# Patient Record
Sex: Female | Born: 1995 | Race: Black or African American | Hispanic: No | Smoking: Former smoker
Health system: Southern US, Community
[De-identification: ages and names within clinical notes are randomized; demographics above are authoritative.]

## PROBLEM LIST (undated history)

## (undated) DIAGNOSIS — L509 Urticaria, unspecified: Secondary | ICD-10-CM

## (undated) HISTORY — DX: Urticaria, unspecified: L50.9

---
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
## 2015-02-25 ENCOUNTER — Emergency Department (INDEPENDENT_AMBULATORY_CARE_PROVIDER_SITE_OTHER)
Admission: EM | Admit: 2015-02-25 | Discharge: 2015-02-25 | Disposition: A | Discharge status: Home / Self Care | Payer: BLUE CROSS/BLUE SHIELD | Source: Home / Self Care | Attending: Family Medicine | Admitting: Family Medicine

## 2015-02-25 ENCOUNTER — Encounter (HOSPITAL_COMMUNITY): Payer: Self-pay | Admitting: Emergency Medicine

## 2015-02-25 DIAGNOSIS — B349 Viral infection, unspecified: Secondary | ICD-10-CM | POA: Diagnosis not present

## 2015-02-25 LAB — POCT RAPID STREP A: STREPTOCOCCUS, GROUP A SCREEN (DIRECT): NEGATIVE

## 2015-02-25 NOTE — Discharge Instructions (Signed)
It is a pleasure to see you today.  I believe your symptoms are related to a viral illness. Given the absence of fevers or more severe respiratory symptoms (cough, nasal discharge), I do not believe it is appropriate to start treatment with antiviral medications for the flu.   I recommend increasing fluid intake; use ibuprofen 200mg  tablets, take 2 to 4 tablets by mouth every 6 to 8 hours as needed for aches and malaise.   Please return to the Urgent Care Center or your primary doctor if symptoms worsen.

## 2015-02-25 NOTE — ED Notes (Signed)
The patient presented to the [facility] with a complaint of general weakness, body aches and a sore throat that started yesterday.

## 2015-02-25 NOTE — ED Provider Notes (Signed)
CSN: [id number]     Arrival date & time 02/25/15  1832 History   First MD Initiated Contact with Patient 02/25/15 1913     Chief Complaint  Patient presents with  . Sore Throat  . Fatigue  . Generalized Body Aches   (Consider location/radiation/quality/duration/timing/severity/associated sxs/prior Treatment) Patient is a 19 y.o. female presenting with pharyngitis. The history is provided by the patient. No language interpreter was used.  Sore Throat  Patient presents with generalized malaise and body aches, headache and fatigue which began yesterday afternoon.  Abrupt in onset.  Has had headache associated with generalized body aches.  Some sore throat present today.  No cough, no rhinorrhea, no otalgia or otorrhea.  She took some acetaminophen which did not help the pain and malaise this morning.  No known sick contacts.  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   History reviewed. No pertinent past medical history. History reviewed. No pertinent past surgical history. History reviewed. No pertinent family history. Social History  Substance Use Topics  . Smoking status: Current Some Day Smoker    Types: Cigarettes  . Smokeless tobacco: None  . Alcohol Use: Yes     Comment: occ   OB History    No data available     Review of Systems  Constitutional: Positive for chills and fatigue. Negative for fever.  HENT: Negative for congestion, ear discharge and ear pain.   Respiratory: Negative for cough, chest tightness and wheezing.   Genitourinary:       No vaginal discharge, no diarrhea, no urinary frequency or urgency. No dysuria.     Allergies  Review of patient's allergies indicates no known allergies.  Home Medications   Prior to Admission medications   Medication Sig Start Date End Date Taking? Authorizing Provider  ibuprofen (ADVIL,MOTRIN) 600 MG tablet Take 1 tablet (600 mg total) by mouth every 6 (six) hours as needed for pain. 12/20/12    Renne CriglerJoshua Geiple, PA-C  norgestimate-ethinyl estradiol (ORTHO-CYCLEN,SPRINTEC,PREVIFEM) 0.25-35 MG-MCG tablet Take 1 tablet by mouth daily.    Historical Provider, MD   Meds Ordered and Administered this Visit  Medications - No data to display  BP 113/77 mmHg  Pulse 91  Temp(Src) 99 F (37.2 C) (Oral)  SpO2 97%  LMP 02/11/2015 No data found.   Physical Exam  Constitutional:  No acute distress.  Does appear mildly uncomfortable and is able to give detailed history  HENT:  Head: Normocephalic and atraumatic.  Right Ear: External ear normal.  Left Ear: External ear normal.  Mildly injected oropharynx without exudate. Moist mucus membranes.  Frontal and maxillary sinuses without tenderness.    Eyes: Conjunctivae and EOM are normal. Pupils are equal, round, and reactive to light. Right eye exhibits no discharge. Left eye exhibits no discharge. No scleral icterus.  Neck: Normal range of motion. Neck supple.  Shotty anterior cervical adenopathy.   Cardiovascular: Normal rate and regular rhythm.   Pulmonary/Chest: Effort normal and breath sounds normal. No respiratory distress. She has no wheezes. She has no rales. She exhibits no tenderness.  Abdominal: Soft. Bowel sounds are normal. She exhibits no distension and no mass. There is no tenderness. There is no rebound and no guarding.  Lymphadenopathy:    She has cervical adenopathy.    ED Course  Procedures (including critical care time)  Labs Review Labs Reviewed  POCT RAPID STREP A    Imaging Review No results found.   Visual Acuity Review  Right Eye Distance:   Left Eye Distance:   Bilateral Distance:    Right Eye Near:   Left Eye Near:    Bilateral Near:         MDM  No diagnosis found. Patient with generalized malaise and fatigue, no documented fevers. Negative rapid strep in [facility] today. Given absence of fevers and lack of respiratory sxs other than some mild sore throat, will not treat empirically for  influenza at this time. Likely viral illness, to treat with NSAIDs as needed, encouraged flu shot this season.     Barbaraann Barthel, MD 02/25/15 Barry Brunner

## 2015-02-27 LAB — CULTURE, GROUP A STREP: STREP A CULTURE: NEGATIVE

## 2015-02-28 NOTE — ED Notes (Signed)
Final report strep negative 

## 2015-04-27 ENCOUNTER — Emergency Department (HOSPITAL_COMMUNITY)
Admission: EM | Admit: 2015-04-27 | Discharge: 2015-04-28 | Disposition: A | Discharge status: Home / Self Care | Payer: BLUE CROSS/BLUE SHIELD | Attending: Emergency Medicine | Admitting: Emergency Medicine

## 2015-04-27 ENCOUNTER — Encounter (HOSPITAL_COMMUNITY): Payer: Self-pay | Admitting: Vascular Surgery

## 2015-04-27 DIAGNOSIS — B373 Candidiasis of vulva and vagina: Secondary | ICD-10-CM | POA: Diagnosis not present

## 2015-04-27 DIAGNOSIS — F17210 Nicotine dependence, cigarettes, uncomplicated: Secondary | ICD-10-CM | POA: Insufficient documentation

## 2015-04-27 DIAGNOSIS — N898 Other specified noninflammatory disorders of vagina: Secondary | ICD-10-CM | POA: Diagnosis present

## 2015-04-27 DIAGNOSIS — B3731 Acute candidiasis of vulva and vagina: Secondary | ICD-10-CM

## 2015-04-27 NOTE — ED Provider Notes (Signed)
CSN: [id number]     Arrival date & time 04/27/15  2206 History   First MD Initiated Contact with Patient 04/27/15 2217     Chief Complaint  Patient presents with  . Vaginal Discharge     (Consider location/radiation/quality/duration/timing/severity/associated sxs/prior Treatment) Patient is a 19 y.o. female presenting with vaginal discharge. The history is provided by the patient. No language interpreter was used.  Vaginal Discharge Quality:  White and thick Severity:  Mild Onset quality:  Gradual Duration:  1 day Chronicity:  New Associated symptoms: no abdominal pain, no dysuria, no fever and no nausea   Associated symptoms comment:  Vaginal itching, swelling and "yogurt like" discharge since last night. No history of vaginal yeast infections. No other vaginal discharge. She denies abdominal pain, dysuria, nausea or vomiting. She denies recent antibiotic use. Nuva Ring started 3 months ago.   History reviewed. No pertinent past medical history. History reviewed. No pertinent past surgical history. No family history on file. Social History  Substance Use Topics  . Smoking status: Current Some Day Smoker    Types: Cigarettes  . Smokeless tobacco: None  . Alcohol Use: Yes     Comment: occ   OB History    No data available     Review of Systems  Constitutional: Negative for fever and chills.  Gastrointestinal: Negative.  Negative for nausea and abdominal pain.  Genitourinary: Positive for vaginal discharge. Negative for dysuria, menstrual problem and pelvic pain.  Musculoskeletal: Negative.  Negative for myalgias.  Neurological: Negative.       Allergies  Review of patient's allergies indicates no known allergies.  Home Medications   Prior to Admission medications   Medication Sig Start Date End Date Taking? Authorizing Provider  etonogestrel-ethinyl estradiol (NUVARING) 0.12-0.015 MG/24HR vaginal ring Place 1 each vaginally every 28 (twenty-eight) days. Insert  vaginally and leave in place for 3 consecutive weeks, then remove for 1 week.   Yes Historical Provider, MD  ibuprofen (ADVIL,MOTRIN) 600 MG tablet Take 1 tablet (600 mg total) by mouth every 6 (six) hours as needed for pain. Patient not taking: Reported on 04/27/2015 12/20/12   Renne Crigler, PA-C   BP 115/73 mmHg  Pulse 95  Temp(Src) 98.2 F (36.8 C) (Oral)  Resp 16  Ht  (1.6 m)  Wt 61.236 kg  BMI 23.92 kg/m2  SpO2 99%  LMP 04/11/2015 Physical Exam  Constitutional: She is oriented to person, place, and time. She appears well-developed and well-nourished.  HENT:  Head: Normocephalic.  Neck: Normal range of motion. Neck supple.  Cardiovascular: Normal rate and regular rhythm.   Pulmonary/Chest: Effort normal and breath sounds normal.  Abdominal: Soft. Bowel sounds are normal. There is no tenderness. There is no rebound and no guarding.  Genitourinary:  Mild vulvar swelling and redness that is generalized. No rash or blister. There is thick, white vaginal discharge c/w yeast. Cervix unremarkable in appearance and nontender. No adnexal mass or tenderness.   Musculoskeletal: Normal range of motion.  Neurological: She is alert and oriented to person, place, and time.  Skin: Skin is warm and dry. No rash noted.  Psychiatric: She has a normal mood and affect.    ED Course  Procedures (including critical care time) Labs Review Labs Reviewed  WET PREP, GENITAL  GC/CHLAMYDIA PROBE AMP ([place]) NOT AT [facility]    Imaging Review No results found. I have personally reviewed and evaluated these images and lab results as part of my medical decision-making.   EKG Interpretation  None      MDM   Final diagnoses:  None    1. Yeast vaginitis  Diflucan provided here. Suggested Monistat for control of itching and GYN follow up.    Nikie Cid, PA-C 12/23/Elpidio Anis16 0016  Laurence Spatesachel Morgan Little, MD 04/28/15 [phone]

## 2015-04-27 NOTE — ED Notes (Signed)
Pt reports to the ED for eval of vaginal irritation, labial swelling, and white vaginal d/c. Reports some vaginal itching as well. Denies any recent unprotected sex, abx use, or douching. Pt A&Ox4, resp e/u, and skin warm and dry.

## 2015-04-28 LAB — WET PREP, GENITAL
SPERM: NONE SEEN
Trich, Wet Prep: NONE SEEN
WBC WET PREP: NONE SEEN
YEAST WET PREP: NONE SEEN

## 2015-04-28 MED ORDER — FLUCONAZOLE 100 MG PO TABS
150.0000 mg | ORAL_TABLET | Freq: Once | ORAL | Status: AC
  Administered 2015-04-28: 150 mg via ORAL
  Filled 2015-04-28: qty 2

## 2015-04-28 NOTE — Discharge Instructions (Signed)

## 2015-06-29 ENCOUNTER — Encounter (HOSPITAL_COMMUNITY): Payer: Self-pay | Admitting: Emergency Medicine

## 2015-06-29 ENCOUNTER — Emergency Department (INDEPENDENT_AMBULATORY_CARE_PROVIDER_SITE_OTHER)
Admission: EM | Admit: 2015-06-29 | Discharge: 2015-06-29 | Disposition: A | Discharge status: Home / Self Care | Payer: BLUE CROSS/BLUE SHIELD | Source: Home / Self Care | Attending: Emergency Medicine | Admitting: Emergency Medicine

## 2015-06-29 ENCOUNTER — Other Ambulatory Visit (HOSPITAL_COMMUNITY)
Admission: RE | Admit: 2015-06-29 | Discharge: 2015-06-29 | Disposition: A | Discharge status: Home / Self Care | Payer: BLUE CROSS/BLUE SHIELD | Source: Ambulatory Visit | Attending: Emergency Medicine | Admitting: Emergency Medicine

## 2015-06-29 DIAGNOSIS — Z113 Encounter for screening for infections with a predominantly sexual mode of transmission: Secondary | ICD-10-CM | POA: Diagnosis present

## 2015-06-29 DIAGNOSIS — N760 Acute vaginitis: Secondary | ICD-10-CM

## 2015-06-29 DIAGNOSIS — A499 Bacterial infection, unspecified: Secondary | ICD-10-CM | POA: Diagnosis not present

## 2015-06-29 DIAGNOSIS — B9689 Other specified bacterial agents as the cause of diseases classified elsewhere: Secondary | ICD-10-CM

## 2015-06-29 LAB — POCT URINALYSIS DIP (DEVICE)
Bilirubin Urine: NEGATIVE
Glucose, UA: NEGATIVE mg/dL
Hgb urine dipstick: NEGATIVE
Ketones, ur: NEGATIVE mg/dL
Leukocytes, UA: NEGATIVE
NITRITE: NEGATIVE
PH: 7 (ref 5.0–8.0)
PROTEIN: NEGATIVE mg/dL
Specific Gravity, Urine: 1.015 (ref 1.005–1.030)
UROBILINOGEN UA: 0.2 mg/dL (ref 0.0–1.0)

## 2015-06-29 LAB — POCT PREGNANCY, URINE: PREG TEST UR: NEGATIVE

## 2015-06-29 MED ORDER — METRONIDAZOLE 500 MG PO TABS: 500.0000 mg | ORAL_TABLET | Freq: Two times a day (BID) | ORAL | Status: DC

## 2015-06-29 MED ORDER — FLUCONAZOLE 150 MG PO TABS: 150.0000 mg | ORAL_TABLET | Freq: Once | ORAL | Status: DC

## 2015-06-29 NOTE — ED Notes (Signed)
Pt reports vaginal discharge with no odor, itching, or burning.  She reports a sexual encounter about 10 days ago and the symptoms started about a week ago.  Pt would like to be tested for STD's, but is sure she has BV.

## 2015-06-29 NOTE — ED Provider Notes (Signed)
CSN: [id number]     Arrival date & time 06/29/15  1452 History   First MD Initiated Contact with Patient 06/29/15 1558     Chief Complaint  Patient presents with  . Vaginal Discharge   (Consider location/radiation/quality/duration/timing/severity/associated sxs/prior Treatment) HPI  She is a 20 year old woman here for evaluation of vaginal discharge. She states this started about a week ago. She reports a white discharge. She denies any itching or odor. No urinary symptoms. No abdominal pain. No fevers or chills. She did have sex about 10 days ago. This is with a known partner and she did not use a condom. She states she has had BV in the past after sexual encounters.  History reviewed. No pertinent past medical history. History reviewed. No pertinent past surgical history. History reviewed. No pertinent family history. Social History  Substance Use Topics  . Smoking status: Current Some Day Smoker    Types: Cigarettes  . Smokeless tobacco: None  . Alcohol Use: Yes     Comment: occ   OB History    No data available     Review of Systems As in history of present illness Allergies  Review of patient's allergies indicates no known allergies.  Home Medications   Prior to Admission medications   Medication Sig Start Date End Date Taking? Authorizing Provider  etonogestrel-ethinyl estradiol (NUVARING) 0.12-0.015 MG/24HR vaginal ring Place 1 each vaginally every 28 (twenty-eight) days. Insert vaginally and leave in place for 3 consecutive weeks, then remove for 1 week.   Yes Historical Provider, MD  metroNIDAZOLE (FLAGYL) 500 MG tablet Take 1 tablet (500 mg total) by mouth 2 (two) times daily. 06/29/15   Charm Rings, MD   Meds Ordered and Administered this Visit  Medications - No data to display  BP 119/79 mmHg  Pulse 79  Temp(Src) 98.7 F (37.1 C) (Oral)  Resp 16  SpO2 100%  LMP 05/23/2015 (Exact Date) No data found.   Physical Exam  Constitutional: She is oriented to  person, place, and time. She appears well-developed and well-nourished. No distress.  Cardiovascular: Normal rate.   Pulmonary/Chest: Effort normal.  Genitourinary: Cervix exhibits no motion tenderness and no discharge. No bleeding in the vagina. No foreign body around the vagina. Vaginal discharge (white ) found.  Neurological: She is alert and oriented to person, place, and time.    ED Course  Procedures (including critical care time)  Labs Review Labs Reviewed  POCT URINALYSIS DIP (DEVICE)  POCT PREGNANCY, URINE  CERVICOVAGINAL ANCILLARY ONLY    Imaging Review No results found.    MDM   1. BV (bacterial vaginosis)    We'll treat with Flagyl. Also recommended a daily probiotic to try to prevent future episodes. STD testing collected. Will call if any positive results. Follow-up as needed.    Charm Rings, MD 06/29/15 [phone]

## 2015-06-29 NOTE — Discharge Instructions (Signed)
It looks like BV. Take Flagyl twice a day for 7 days. No alcohol. You can try taking a daily probiotic to prevent future episodes. Avoid any scented products in the vaginal area. We will call you if any of your testing comes back positive. Follow-up as needed.

## 2015-06-30 LAB — CERVICOVAGINAL ANCILLARY ONLY
CHLAMYDIA, DNA PROBE: NEGATIVE
NEISSERIA GONORRHEA: NEGATIVE
WET PREP (BD AFFIRM): POSITIVE — AB

## 2015-07-03 ENCOUNTER — Telehealth (HOSPITAL_COMMUNITY): Payer: Self-pay | Admitting: Emergency Medicine

## 2015-07-03 NOTE — ED Notes (Signed)
Called pt and notified of recent lab results from visit 2/23 Pt ID'd properly... Reports feeling better and sx have subsided w/o taking Flagyl yet ... Reports she will begin treatment today  Per Dr. Dayton Scrape,  Please let patient know that test for gardnerella (bacterial vaginosis) was positive. Rx for metronidazole was given at [facility] visit 06/29/15; finish metronidazole.   Tests for gonorrhea/chlamydia were negative. Recheck for persistent symptoms. LM  Adv pt if sx are not getting better to return  Education on safe sex given Pt verb understanding.

## 2015-11-13 ENCOUNTER — Encounter (HOSPITAL_COMMUNITY): Payer: Self-pay | Admitting: Emergency Medicine

## 2015-11-13 ENCOUNTER — Ambulatory Visit (HOSPITAL_COMMUNITY)
Admission: EM | Admit: 2015-11-13 | Discharge: 2015-11-13 | Disposition: A | Discharge status: Home / Self Care | Payer: BLUE CROSS/BLUE SHIELD | Attending: Emergency Medicine | Admitting: Emergency Medicine

## 2015-11-13 DIAGNOSIS — F17210 Nicotine dependence, cigarettes, uncomplicated: Secondary | ICD-10-CM | POA: Insufficient documentation

## 2015-11-13 DIAGNOSIS — N760 Acute vaginitis: Secondary | ICD-10-CM | POA: Diagnosis not present

## 2015-11-13 DIAGNOSIS — N898 Other specified noninflammatory disorders of vagina: Secondary | ICD-10-CM | POA: Diagnosis present

## 2015-11-13 LAB — POCT PREGNANCY, URINE: Preg Test, Ur: NEGATIVE

## 2015-11-13 MED ORDER — METRONIDAZOLE 500 MG PO TABS: 500.0000 mg | ORAL_TABLET | Freq: Two times a day (BID) | ORAL | Status: DC

## 2015-11-13 MED ORDER — FLUCONAZOLE 150 MG PO TABS: 150.0000 mg | ORAL_TABLET | Freq: Once | ORAL | Status: DC

## 2015-11-13 NOTE — ED Provider Notes (Signed)
HPI  SUBJECTIVE:  Erin Garcia is a 20 y.o. female who presents with  1 week of odorous vaginal discharge No urgency, frequency, dysuria, oderous urine, hematuria,  genital blisters, vaginal itching. No aggravating, alleviating factors. Has tried soaking tampons in apple cider vinegar without improvement/ No fevers, N/V, abd pain, back pain.. Pt sexually active with same female partner of several months who is asxatic.   does not use condoms. has a NuvaRing for birth control STD's not a concern today. Similar sx before when had BV. Also has a history of yeast vaginitis. No h/o  gonorrhea chlamydia, Trichomonas. No h/o syphilis, herpes, HIV. No h/o PID, ectopic pregnancy. No h/o DM.     History reviewed. No pertinent past medical history.  History reviewed. No pertinent past surgical history.  No family history on file.  Social History  Substance Use Topics  . Smoking status: Current Some Day Smoker    Types: Cigarettes  . Smokeless tobacco: None  . Alcohol Use: Yes     Comment: occ    No current facility-administered medications for this encounter.  Current outpatient prescriptions:  .  etonogestrel-ethinyl estradiol (NUVARING) 0.12-0.015 MG/24HR vaginal ring, Place 1 each vaginally every 28 (twenty-eight) days. Insert vaginally and leave in place for 3 consecutive weeks, then remove for 1 week., Disp: , Rfl:  .  fluconazole (DIFLUCAN) 150 MG tablet, Take 1 tablet (150 mg total) by mouth once. Take 2nd pill if symptoms not completely resolved in 3 days., Disp: 2 tablet, Rfl: 0 .  metroNIDAZOLE (FLAGYL) 500 MG tablet, Take 1 tablet (500 mg total) by mouth 2 (two) times daily., Disp: 14 tablet, Rfl: 0  No Known Allergies   ROS  As noted in HPI.   Physical Exam  BP 125/79 mmHg  Pulse 86  Temp(Src) 98.3 F (36.8 C) (Oral)  Resp 12  SpO2 100%  LMP 10/30/2015  Constitutional: Well developed, well nourished, no acute distress Eyes:  EOMI, conjunctiva normal  bilaterally HENT: Normocephalic, atraumatic,mucus membranes moist Respiratory: Normal inspiratory effort Cardiovascular: Normal rate GI: nondistended soft, nontender. No suprapubic tenderness  back: No CVA tenderness GU: External genitalia normal.  Normal vaginal mucosa.  Normal os. Thin  oderous white vaginal discharge.   Uterus smooth, NT. No  CMT. No  adnexal tenderness. No adnexal masses.  Chaperone present during exam skin: No rash, skin intact Musculoskeletal: no deformities Neurologic: Alert & oriented x 3, no focal neuro deficits Psychiatric: Speech and behavior appropriate   ED Course   Medications - No data to display  Orders Placed This Encounter  Procedures  . Pregnancy, urine POC    Standing Status: Standing     Number of Occurrences: 1     Standing Expiration Date:     Results for orders placed or performed during the hospital encounter of 11/13/15 (from the past 24 hour(s))  Pregnancy, urine POC     Status: None   Collection Time: 11/13/15 10:48 AM  Result Value Ref Range   Preg Test, Ur NEGATIVE NEGATIVE   No results found.  ED Clinical Impression  Vaginitis   ED Assessment/Plan  Previous records reviewed. Patient has tested positive for BV and negative for gonorrhea,  Chlamydia, Trichomonas, yeast in the past.  Patient not pregnant today.  H&P most c/w  BV . Sent off GC/chlamydia, wet prep. Will not treat empirically For STD's now.  Advised pt to refrain from sexual contact until she knows lab results, symptoms resolve, and partner(s) are treated if necessary. Pt  provided working phone number. Pt agrees with plan.    *This clinic note was created using Dragon dictation software. Therefore, there may be occasional mistakes despite careful proofreading.  ?    Domenick Gong, MD 11/13/15 1050

## 2015-11-13 NOTE — Discharge Instructions (Signed)
If you need help with getting financial assistance, assistance in getting medications or finding a primary care physican, contact Maggy Mena here at the Urgent Care [facility]. Her phone number is [phone].  ° °[facility] °[phone] °[address] °[place] [address] ° °[facility]: [address] °[place] [address]  °[phone] ° °[facility]: [address] °[place] [address]   °[phone] ° °[facility]: [address] °[place] [address]  °[phone] ° °[facility] : [address] [place] [address] °[phone] ° °[facility]: [address] °[place] [address] °[phone] ° °[facility]: [address] °[place] [address] °[phone] ° °Dr. Mahima Pandey [address] [facility] [place]  [address]  °[phone] ° °Dr. George Osei-Bonsu, [facility]. [address] [place], [place] [address]  °[phone] ° °

## 2015-11-13 NOTE — ED Notes (Signed)
Vaginal discharge for one week.  Patient thinks she has bv.  Denies pain, denies urinary symptoms.

## 2015-11-14 LAB — CERVICOVAGINAL ANCILLARY ONLY
CHLAMYDIA, DNA PROBE: NEGATIVE
NEISSERIA GONORRHEA: NEGATIVE
WET PREP (BD AFFIRM): POSITIVE — AB

## 2018-05-21 ENCOUNTER — Encounter (HOSPITAL_BASED_OUTPATIENT_CLINIC_OR_DEPARTMENT_OTHER): Payer: Self-pay | Admitting: *Deleted

## 2018-05-21 ENCOUNTER — Emergency Department (HOSPITAL_BASED_OUTPATIENT_CLINIC_OR_DEPARTMENT_OTHER): Payer: Self-pay

## 2018-05-21 ENCOUNTER — Emergency Department (HOSPITAL_BASED_OUTPATIENT_CLINIC_OR_DEPARTMENT_OTHER)
Admission: EM | Admit: 2018-05-21 | Discharge: 2018-05-21 | Disposition: A | Discharge status: Home / Self Care | Payer: Self-pay | Attending: Emergency Medicine | Admitting: Emergency Medicine

## 2018-05-21 ENCOUNTER — Other Ambulatory Visit: Payer: Self-pay

## 2018-05-21 DIAGNOSIS — R2243 Localized swelling, mass and lump, lower limb, bilateral: Secondary | ICD-10-CM | POA: Insufficient documentation

## 2018-05-21 DIAGNOSIS — R071 Chest pain on breathing: Secondary | ICD-10-CM | POA: Insufficient documentation

## 2018-05-21 DIAGNOSIS — R079 Chest pain, unspecified: Secondary | ICD-10-CM

## 2018-05-21 DIAGNOSIS — R21 Rash and other nonspecific skin eruption: Secondary | ICD-10-CM | POA: Insufficient documentation

## 2018-05-21 DIAGNOSIS — Z87891 Personal history of nicotine dependence: Secondary | ICD-10-CM | POA: Insufficient documentation

## 2018-05-21 LAB — CBC
HCT: 40.2 % (ref 36.0–46.0)
Hemoglobin: 13 g/dL (ref 12.0–15.0)
MCH: 28.3 pg (ref 26.0–34.0)
MCHC: 32.3 g/dL (ref 30.0–36.0)
MCV: 87.6 fL (ref 80.0–100.0)
Platelets: 337 10*3/uL (ref 150–400)
RBC: 4.59 MIL/uL (ref 3.87–5.11)
RDW: 11.9 % (ref 11.5–15.5)
WBC: 9.4 10*3/uL (ref 4.0–10.5)
nRBC: 0 % (ref 0.0–0.2)

## 2018-05-21 LAB — BASIC METABOLIC PANEL
Anion gap: 6 (ref 5–15)
BUN: 12 mg/dL (ref 6–20)
CO2: 28 mmol/L (ref 22–32)
Calcium: 9.1 mg/dL (ref 8.9–10.3)
Chloride: 104 mmol/L (ref 98–111)
Creatinine, Ser: 0.98 mg/dL (ref 0.44–1.00)
GFR calc Af Amer: >60 mL/min (ref >60)
GFR calc non Af Amer: >60 mL/min (ref >60)
Glucose, Bld: 89 mg/dL (ref 70–99)
Potassium: 4 mmol/L (ref 3.5–5.1)
Sodium: 138 mmol/L (ref 135–145)

## 2018-05-21 LAB — PREGNANCY, URINE: Preg Test, Ur: NEGATIVE

## 2018-05-21 LAB — D-DIMER, QUANTITATIVE: D-Dimer, Quant: 1.37 ug/mL-FEU — ABNORMAL HIGH (ref 0.00–0.50)

## 2018-05-21 MED ORDER — IOPAMIDOL (ISOVUE-370) INJECTION 76%: 100.0000 mL | Freq: Once | INTRAVENOUS | Status: DC | PRN

## 2018-05-21 MED ORDER — HYDROXYZINE HCL 25 MG PO TABS: 25.0000 mg | ORAL_TABLET | Freq: Four times a day (QID) | ORAL | 0 refills | Status: AC | PRN

## 2018-05-21 MED ORDER — IOPAMIDOL (ISOVUE-370) INJECTION 76%
100.0000 mL | Freq: Once | INTRAVENOUS | Status: AC | PRN
  Administered 2018-05-21: 65 mL via INTRAVENOUS

## 2018-05-21 NOTE — ED Provider Notes (Signed)
[facility] Emergency Department Provider Note MRN:  [id number]  Arrival date & time: 05/21/18     Chief Complaint   Chest Pain   History of Present Illness   Erin Garcia is a 23 y.o. year-old female with no pertinent past medical history presenting to the ED with chief complaint of chest pain.  Chest pain is located in the center of the chest, described as a stabbing pain, sudden onset 2 hours prior to arrival, has been intermittent since that time.  Denies any associated dizziness or diaphoresis, no nausea or vomiting, no shortness of breath.  Patient explains the pain is worse with deep breathing.  Patient has noticed intermittent ankle swelling for the past few weeks.  Patient was on a 4 to 5-hour plane ride last month.  Patient uses oral contraceptive pills intermittently.  Denies recent fever or cough, no abdominal pain.  Patient also endorsing several weeks of intermittent hives, when they come they are located on the chest, abdomen, and back.  They usually occur at night and in the morning and improves during the day.  Denies any new soaps or detergents, no new exposures, no known allergies.  Review of Systems  A complete 10 system review of systems was obtained and all systems are negative except as noted in the HPI and PMH.   Patient's Health History   History reviewed. No pertinent past medical history.  History reviewed. No pertinent surgical history.  History reviewed. No pertinent family history.  Social History   Socioeconomic History  . Marital status: Single    Spouse name: Not on file  . Number of children: Not on file  . Years of education: Not on file  . Highest education level: Not on file  Occupational History  . Not on file  Social Needs  . Financial resource strain: Not on file  . Food insecurity:    Worry: Not on file    Inability: Not on file  . Transportation needs:    Medical: Not on file    Non-medical: Not on file    Tobacco Use  . Smoking status: Former Smoker    Types: Cigarettes  . Smokeless tobacco: Never Used  Substance and Sexual Activity  . Alcohol use: Yes    Comment: occ  . Drug use: Not Currently  . Sexual activity: Yes    Birth control/protection: Pill  Lifestyle  . Physical activity:    Days per week: Not on file    Minutes per session: Not on file  . Stress: Not on file  Relationships  . Social connections:    Talks on phone: Not on file    Gets together: Not on file    Attends religious service: Not on file    Active member of club or organization: Not on file    Attends meetings of clubs or organizations: Not on file    Relationship status: Not on file  . Intimate partner violence:    Fear of current or ex partner: Not on file    Emotionally abused: Not on file    Physically abused: Not on file    Forced sexual activity: Not on file  Other Topics Concern  . Not on file  Social History Narrative  . Not on file     Physical Exam  Vital Signs and Nursing Notes reviewed Vitals:   05/21/18 1700 05/21/18 1721  BP:  115/89  Pulse: 80 89  Resp: 14 (!) 22  Temp:  SpO2: 100% 100%    CONSTITUTIONAL: Well-appearing, NAD NEURO:  Alert and oriented x 3, no focal deficits EYES:  eyes equal and reactive ENT/NECK:  no LAD, no JVD CARDIO: Regular rate, well-perfused, normal S1 and S2 PULM:  CTAB no wheezing or rhonchi GI/GU:  normal bowel sounds, non-distended, non-tender MSK/SPINE:  No gross deformities, no edema SKIN:  no rash, atraumatic PSYCH:  Appropriate speech and behavior  Diagnostic and Interventional Summary    EKG Interpretation  Date/Time:  Thursday May 21 2018 14:58:10 EST Ventricular Rate:  92 PR Interval:    QRS Duration: 83 QT Interval:  340 QTC Calculation: 421 R Axis:   19 Text Interpretation:  Sinus rhythm Confirmed by Kennis CarinaBero, Shahara Hartsfield [phone] on 05/21/2018 3:23:40 PM      Labs Reviewed  D-DIMER, QUANTITATIVE (NOT AT [facility]) - Abnormal;  Notable for the following components:      Result Value   D-Dimer, Quant 1.37 (*)    All other components within normal limits  CBC  BASIC METABOLIC PANEL  PREGNANCY, URINE    CT ANGIO CHEST PE W OR WO CONTRAST  Final Result    DG Chest 2 View  Final Result      Medications  iopamidol (ISOVUE-370) 76 % injection 100 mL (65 mLs Intravenous Contrast Given 05/21/18 1724)     Procedures Critical Care  ED Course and Medical Decision Making  I have reviewed the triage vital signs and the nursing notes.  Pertinent labs & imaging results that were available during my care of the patient were reviewed by me and considered in my medical decision making (see below for details).  Considered low risk for PE, no risk factors to suggest ACS.  Will screen with a d-dimer.  Rash not currently present.  D-dimer positive, CTA negative.  Labs otherwise unremarkable.  No recurrence of chest pain, patient is well-appearing and appropriate for discharge.  Provided with Atarax prescription to be used as needed for recurrence of hives, advised to return the emergency department with any facial swelling or shortness of breath with hives.  Encouraged to follow-up with a PCP or dermatologist if her rash continue.  After the discussed management above, the patient was determined to be safe for discharge.  The patient was in agreement with this plan and all questions regarding their care were answered.  ED return precautions were discussed and the patient will return to the ED with any significant worsening of condition.  Elmer SowMichael M. Pilar PlateBero, MD [facility] Emergency Medicine [facility] [email or web address]  Final Clinical Impressions(s) / ED Diagnoses     ICD-10-CM   1. Rash R21   2. Chest pain R07.9 DG Chest 2 View    DG Chest 2 View    ED Discharge Orders         Ordered    hydrOXYzine (ATARAX/VISTARIL) 25 MG tablet  Every 6 hours PRN     05/21/18 1831             Sabas SousBero,  Hence Derrick M, MD 05/21/18 1835

## 2018-05-21 NOTE — ED Triage Notes (Signed)
pt c/o Cp , SOb while sitting at desk at school x 2 hrs ago

## 2018-05-21 NOTE — ED Notes (Signed)
Pt verbalizes understanding of d/c instructions and denies any further needs at this time. 

## 2018-05-21 NOTE — Discharge Instructions (Addendum)
You were evaluated in the Emergency Department and after careful evaluation, we did not find any emergent condition requiring admission or further testing in the hospital. ° °Please return to the Emergency Department if you experience any worsening of your condition.  We encourage you to follow up with a primary care provider.  Thank you for allowing us to be a part of your care. °

## 2018-05-21 NOTE — ED Notes (Signed)
Patient transported to CT 

## 2018-05-21 NOTE — ED Notes (Signed)
Pt on monitor 

## 2018-05-26 ENCOUNTER — Encounter: Payer: Self-pay | Admitting: Allergy & Immunology

## 2018-05-26 ENCOUNTER — Ambulatory Visit (INDEPENDENT_AMBULATORY_CARE_PROVIDER_SITE_OTHER): Payer: Self-pay | Admitting: Allergy & Immunology

## 2018-05-26 VITALS — BP 110/72 | HR 89 | Temp 98.2°F | Resp 20 | Ht 64.2 in | Wt 173.6 lb

## 2018-05-26 DIAGNOSIS — L508 Other urticaria: Secondary | ICD-10-CM

## 2018-05-26 DIAGNOSIS — Z8709 Personal history of other diseases of the respiratory system: Secondary | ICD-10-CM

## 2018-05-26 MED ORDER — MONTELUKAST SODIUM 10 MG PO TABS: 10.0000 mg | ORAL_TABLET | Freq: Every day | ORAL | 5 refills | Status: AC

## 2018-05-26 MED ORDER — MONTELUKAST SODIUM 10 MG PO TABS: 10.0000 mg | ORAL_TABLET | Freq: Every day | ORAL | 5 refills | Status: DC

## 2018-05-26 NOTE — Patient Instructions (Addendum)
1. Acute urticaria - Your history does not have any "red flags" such as fevers, joint pains, or permanent skin changes that would be concerning for a more serious cause of hives.  - We are not going to get labs today to save some money.  - Chronic hives are often times a self limited process and will "burn themselves out" over 6-12 months, although this is not always the case.  - In the meantime, start suppressive dosing of antihistamines:   - Morning: Zyrtec (cetirizine) 10-20mg  (one or two tablets)  - Evening: Zyrtec (cetirizine) 10-20mg  (one or two tablets)  - If the above is not working, try adding: Singulair (montelukast) 10mg  nightly - You can change this dosing at home, decreasing the dose as needed or increasing the dosing as needed.  - Take note of any triggering exposures for the next visit.  - Start the prednisone pack if needed.   2. Return in about 6 months (around 11/24/2018).   Please inform us of any Emergency Department visits, hospitalizations, or changes in symptoms. Call us before going to the ED for breathing or allergy symptoms since we might be able to fit you in for a sick visit. Feel free to contact us anytime with any questions, problems, or concerns.  It was a pleasure to meet you today!  Websites that have reliable patient information: 1. American Academy of Asthma, Allergy, and Immunology: www.aaaai.org 2. Food Allergy Research and Education (FARE): foodallergy.org 3. Mothers of Asthmatics: http://www.asthmacommunitynetwork.org 4. American College of Allergy, Asthma, and Immunology: MissingWeapons.ca   Make sure you are registered to vote! If you have moved or changed any of your contact information, you will need to get this updated before voting!    Voter ID laws are going into effect for the General Election in November 2020! Be prepared! Check out [employer]aping[employer].dk for more details.

## 2018-05-26 NOTE — Progress Notes (Signed)
NEW PATIENT  Date of Service/Encounter:  05/27/18  Referring provider: Patient, No Pcp Per   Assessment:   Acute urticaria  History of asthma - with normal spirometry  Plan/Recommendations:   1. Acute urticaria - Your history does not have any "red flags" such as fevers, joint pains, or permanent skin changes that would be concerning for a more serious cause of hives.  - We are not going to get labs today to save some money.  - Chronic hives are often times a self limited process and will "burn themselves out" over 6-12 months, although this is not always the case.  - In the meantime, start suppressive dosing of antihistamines:   - Morning: Zyrtec (cetirizine) 10-69m (one or two tablets)  - Evening: Zyrtec (cetirizine) 10-247m(one or two tablets)  - If the above is not working, try adding: Singulair (montelukast) 1080mightly - You can change this dosing at home, decreasing the dose as needed or increasing the dosing as needed.  - Take note of any triggering exposures for the next visit.  - Start the prednisone pack if needed.   2. History of asthma - Lung testing was normal today. - ROS negative for uncontrolled asthma symptoms. - No controller needed at this time.   3. Return in about 6 months (around 11/24/2018).   Subjective:   Erin Garcia a [age]o. female presenting today for evaluation of  Chief Complaint  Patient presents with  . Urticaria  . Angioedema    Lips    Erin Garcia a history of the following: Patient Active Problem List   Diagnosis Date Noted  . Acute urticaria 05/26/2018  . History of asthma 05/26/2018    History obtained from: chart review and patient.  Erin Garcia referred by Patient, No Pcp Per.     Erin Garcia a [age]o. female presenting for an evaluation of urticaria. She tells me that she has had hives since December 26th. She was not eating anything out of the ordinary at the time. She tells me that the hives  come and go over the course of hours. There are no permanent skin changes at all and she has no fevers or joint pains with these hives. She denies any problems with throat swelling, wheezing, or abdominal pain. She eats all of the major food allergens without adverse event. She has been taking antihistamines (Benadryl) as needed which does help with the urticaria. She was not ill at the time that these episodes started.   She did go to the ED around one week ago due to this rash and had an extensive workup since she also had some chest pain with this. In the ED, due to the chest pain, she had an EKG, CXR, and CT angio to rule out a PE. All of this was negative. Her D dimer was slightly elevated. CBC and CMP were both normal as well. She also developed some lip swelling in the interim which is what is most concerning for her at this time.  She does have a history of asthma, but it seems that she has not used her Symbicort in over two years. She was given a sample and then used it for 1-2 weeks without any improvement in her symptoms. She denies any coughing at all and overall her symptoms have improved as she has decreased her cigarette use.    Otherwise, there is no history of other atopic diseases, including food allergies, drug allergies, environmental allergies,  stinging insect allergies or eczema. There is no significant infectious history. Vaccinations are up to date.    Past Medical History: Patient Active Problem List   Diagnosis Date Noted  . Acute urticaria 05/26/2018  . History of asthma 05/26/2018    Medication List:  Allergies as of 05/26/2018   No Known Allergies     Medication List       Accurate as of May 26, 2018 11:59 PM. Always use your most recent med list.        albuterol 108 (90 Base) MCG/ACT inhaler Commonly known as:  PROVENTIL HFA;VENTOLIN HFA Inhale into the lungs every 6 (six) hours as needed for wheezing or shortness of breath.   budesonide-formoterol  160-4.5 MCG/ACT inhaler Commonly known as:  SYMBICORT Inhale 2 puffs into the lungs 2 (two) times daily.   diphenhydrAMINE 50 MG tablet Commonly known as:  BENADRYL Take 50 mg by mouth at bedtime as needed for itching.   hydrOXYzine 25 MG tablet Commonly known as:  ATARAX/VISTARIL Take 1 tablet (25 mg total) by mouth every 6 (six) hours as needed for itching.   montelukast 10 MG tablet Commonly known as:  SINGULAIR Take 1 tablet (10 mg total) by mouth at bedtime.   norgestimate-ethinyl estradiol 0.25-35 MG-MCG tablet Commonly known as:  ORTHO-CYCLEN,SPRINTEC,PREVIFEM Take 1 tablet by mouth daily.       Birth History: born at term without complications  Developmental History: Braylyn has met all milestones on time. She has required no speech therapy, occupational therapy and physical therapy.    Past Surgical History: History reviewed. No pertinent surgical history.   Family History: Family History  Problem Relation Age of Onset  . Allergic rhinitis Neg Hx   . Asthma Neg Hx   . Eczema Neg Hx   . Urticaria Neg Hx      Social History: Ginnie lives at home with her family. She lives in a house with carpeting throughout the home. There is gas and electric heating. There are window units for cooling. There are no current animals in the home, but previous occupants did have animals present. There is tobacco exposure in the home, but she only smokes one pack every two weeks (around 1-2 cigarettes per day). She currently is in school to become a pharmacist.     Review of Systems: a 14-point review of systems is pertinent for what is mentioned in HPI.  Otherwise, all other systems were negative. Constitutional: negative other than that listed in the HPI Eyes: negative other than that listed in the HPI Ears, nose, mouth, throat, and face: negative other than that listed in the HPI Respiratory: negative other than that listed in the HPI Cardiovascular: negative other than that  listed in the HPI Gastrointestinal: negative other than that listed in the HPI Genitourinary: negative other than that listed in the HPI Integument: negative other than that listed in the HPI Hematologic: negative other than that listed in the HPI Musculoskeletal: negative other than that listed in the HPI Neurological: negative other than that listed in the HPI Allergy/Immunologic: negative other than that listed in the HPI    Objective:   Blood pressure 110/72, pulse 89, temperature 98.2 F (36.8 C), temperature source Oral, resp. rate 20, height 5' 4.2" (1.631 m), weight 173 lb 9.6 oz (78.7 kg), last menstrual period 05/14/2018, SpO2 99 %. Body mass index is 29.61 kg/m.   Physical Exam:  General: Alert, interactive, in no acute distress. Pleasant and talkative.  Eyes: No conjunctival  injection bilaterally, no discharge on the right, no discharge on the left and no Horner-Trantas dots present. PERRL bilaterally. EOMI without pain. No photophobia.  Ears: Right TM pearly gray with normal light reflex, Left TM pearly gray with normal light reflex, Right TM intact without perforation and Left TM intact without perforation.  Nose/Throat: External nose within normal limits and septum midline. Turbinates edematous and pale with clear discharge. Posterior oropharynx moderately erythematous without cobblestoning in the posterior oropharynx. Tonsils 2+ without exudates.  Tongue without thrush. Neck: Supple without thyromegaly. Trachea midline. Adenopathy: no enlarged lymph nodes appreciated in the anterior cervical, occipital, axillary, epitrochlear, inguinal, or popliteal regions. Lungs: Clear to auscultation without wheezing, rhonchi or rales. No increased work of breathing. CV: Normal S1/S2. No murmurs. Capillary refill <2 seconds.  Abdomen: Nondistended, nontender. No guarding or rebound tenderness. Bowel sounds present in all fields and hypoactive  Skin: Warm and dry, without lesions or  rashes. No urticaria present.  Extremities:  No clubbing, cyanosis or edema. Neuro:   Grossly intact. No focal deficits appreciated. Responsive to questions.  Diagnostic studies:   Spirometry: results normal (FEV1: 2.27/82%, FVC: 2.52/80%, FEV1/FVC: 90%).    Spirometry consistent with normal pattern.   Allergy Studies: none      Salvatore Marvel, MD [facility]

## 2018-05-27 ENCOUNTER — Encounter: Payer: Self-pay | Admitting: Allergy & Immunology

## 2018-05-28 NOTE — Addendum Note (Signed)
Addended by: Mliss Fritz I on: 05/28/2018 07:38 AM   Modules accepted: Orders

## 2018-07-09 ENCOUNTER — Emergency Department (HOSPITAL_COMMUNITY)
Admission: EM | Admit: 2018-07-09 | Discharge: 2018-07-10 | Disposition: A | Discharge status: Home / Self Care | Payer: Self-pay | Attending: Emergency Medicine | Admitting: Emergency Medicine

## 2018-07-09 ENCOUNTER — Other Ambulatory Visit: Payer: Self-pay

## 2018-07-09 ENCOUNTER — Encounter (HOSPITAL_COMMUNITY): Payer: Self-pay | Admitting: Emergency Medicine

## 2018-07-09 DIAGNOSIS — W25XXXA Contact with sharp glass, initial encounter: Secondary | ICD-10-CM | POA: Insufficient documentation

## 2018-07-09 DIAGNOSIS — Z79899 Other long term (current) drug therapy: Secondary | ICD-10-CM | POA: Insufficient documentation

## 2018-07-09 DIAGNOSIS — Y999 Unspecified external cause status: Secondary | ICD-10-CM | POA: Insufficient documentation

## 2018-07-09 DIAGNOSIS — Y93G1 Activity, food preparation and clean up: Secondary | ICD-10-CM | POA: Insufficient documentation

## 2018-07-09 DIAGNOSIS — Y929 Unspecified place or not applicable: Secondary | ICD-10-CM | POA: Insufficient documentation

## 2018-07-09 DIAGNOSIS — J45909 Unspecified asthma, uncomplicated: Secondary | ICD-10-CM | POA: Insufficient documentation

## 2018-07-09 DIAGNOSIS — S61214A Laceration without foreign body of right ring finger without damage to nail, initial encounter: Secondary | ICD-10-CM | POA: Insufficient documentation

## 2018-07-09 MED ORDER — LIDOCAINE HCL 2 % IJ SOLN
10.0000 mL | Freq: Once | INTRAMUSCULAR | Status: AC
  Administered 2018-07-09: 200 mg
  Filled 2018-07-09: qty 20

## 2018-07-09 NOTE — ED Triage Notes (Signed)
Patient was washing a wine glass and cut her right ring finger this evening. Bleeding controlled. No other complaints.

## 2018-07-09 NOTE — ED Provider Notes (Signed)
[facility] EMERGENCY DEPARTMENT Provider Note   CSN: [id number] Arrival date & time: 07/09/18  2039    History   Chief Complaint Chief Complaint  Patient presents with  . Finger Injury    HPI Erin Garcia is a 23 y.o. female with no pertinent past medical history who presents to the emergency department with a chief complaint of right ring finger laceration.  The patient reports that she was washing a wine glass when it broke and cut her right ring finger approximately 2 hours prior to arrival.  She reports mild pain to the finger that is worse with movement.  No other known aggravating or alleviating factors.  No weakness or numbness to the fingers of the right hand.  No history of previous right hand or finger injury or surgery.  Her Tdap was updated in 2012.  No treatment prior to arrival.     The history is provided by the patient. No language interpreter was used.    Past Medical History:  Diagnosis Date  . Urticaria     Patient Active Problem List   Diagnosis Date Noted  . Acute urticaria 05/26/2018  . History of asthma 05/26/2018    History reviewed. No pertinent surgical history.   OB History   No obstetric history on file.      Home Medications    Prior to Admission medications   Medication Sig Start Date End Date Taking? Authorizing Provider  albuterol (PROVENTIL HFA;VENTOLIN HFA) 108 (90 Base) MCG/ACT inhaler Inhale into the lungs every 6 (six) hours as needed for wheezing or shortness of breath.    [provider]  budesonide-formoterol (SYMBICORT) 160-4.5 MCG/ACT inhaler Inhale 2 puffs into the lungs 2 (two) times daily.    [provider]  diphenhydrAMINE (BENADRYL) 50 MG tablet Take 50 mg by mouth at bedtime as needed for itching.    [provider]  hydrOXYzine (ATARAX/VISTARIL) 25 MG tablet Take 1 tablet (25 mg total) by mouth every 6 (six) hours as needed for itching. 05/21/18   Sabas Sous, MD    montelukast (SINGULAIR) 10 MG tablet Take 1 tablet (10 mg total) by mouth at bedtime. 05/26/18   Alfonse Spruce, MD  norgestimate-ethinyl estradiol (ORTHO-CYCLEN,SPRINTEC,PREVIFEM) 0.25-35 MG-MCG tablet Take 1 tablet by mouth daily.    [provider]    Family History Family History  Problem Relation Age of Onset  . Allergic rhinitis Neg Hx   . Asthma Neg Hx   . Eczema Neg Hx   . Urticaria Neg Hx     Social History Social History   Tobacco Use  . Smoking status: Former Smoker    Types: Cigarettes  . Smokeless tobacco: Never Used  Substance Use Topics  . Alcohol use: Yes    Comment: occ  . Drug use: Not Currently     Allergies   Patient has no known allergies.   Review of Systems Review of Systems  Constitutional: Negative for activity change.  Respiratory: Negative for shortness of breath.   Cardiovascular: Negative for chest pain.  Gastrointestinal: Negative for abdominal pain.  Musculoskeletal: Positive for myalgias. Negative for arthralgias, back pain and neck pain.  Skin: Positive for wound. Negative for color change and rash.  Neurological: Negative for weakness and numbness.     Physical Exam Updated Vital Signs BP (!) 132/100 (BP Location: Right Arm)   Pulse 89   Temp 98.3 F (36.8 C) (Oral)   Resp 16   Ht 5\' 3"  (  1.6 m)   Wt 77.1 kg   LMP 06/08/2018 (Exact Date)   SpO2 100%   BMI 30.11 kg/m   Physical Exam Vitals signs and nursing note reviewed.  Constitutional:      General: She is not in acute distress. HENT:     Head: Normocephalic.  Eyes:     Conjunctiva/sclera: Conjunctivae normal.  Neck:     Musculoskeletal: Neck supple.  Cardiovascular:     Rate and Rhythm: Normal rate and regular rhythm.     Heart sounds: No murmur. No friction rub. No gallop.   Pulmonary:     Effort: Pulmonary effort is normal. No respiratory distress.  Abdominal:     General: There is no distension.     Palpations: Abdomen is soft.   Musculoskeletal:     Comments: There is a 2.5 cm V-shaped laceration that extends through the subcutaneous tissue of the right ring finger along the proximal phalanx on the dorsum of the digit.  Capillary refill is less than 2 seconds.  Radial pulses are 2+ and symmetric.  Wound is hemostatic.  No obvious foreign bodies.  5-5 strength against resistance of the right ring finger with flexion and extension.  Full active and passive range of motion of the right ring finger.  No tenderness to palpation along the proximal phalanx, right fourth MCP or PIP joint.  Skin:    General: Skin is warm.     Findings: No rash.  Neurological:     Mental Status: She is alert.  Psychiatric:        Behavior: Behavior normal.      ED Treatments / Results  Labs (all labs ordered are listed, but only abnormal results are displayed) Labs Reviewed - No data to display  EKG None  Radiology No results found.  Procedures .Marland KitchenLaceration Repair Date/Time: 07/10/2018 12:06 AM Performed by: Barkley Boards, PA-C Authorized by: Barkley Boards, PA-C   Consent:    Consent obtained:  Verbal   Consent given by:  Patient   Risks discussed:  Pain, infection, poor cosmetic result, tendon damage and need for additional repair   Alternatives discussed:  No treatment Anesthesia (see MAR for exact dosages):    Anesthesia method:  Local infiltration   Local anesthetic:  Lidocaine 2% w/o epi Laceration details:    Location:  Finger   Finger location:  R ring finger   Length (cm):  2.5 Repair type:    Repair type:  Intermediate Exploration:    Hemostasis achieved with:  Direct pressure   Wound exploration: wound explored through full range of motion and entire depth of wound probed and visualized     Wound extent: no fascia violation noted, no foreign bodies/material noted, no muscle damage noted, no nerve damage noted, no tendon damage noted, no underlying fracture noted and no vascular damage noted      Contaminated: no   Treatment:    Area cleansed with:  Shur-Clens   Amount of cleaning:  Extensive   Visualized foreign bodies/material removed: no   Skin repair:    Repair method:  Sutures   Suture size:  4-0   Suture material:  Prolene   Suture technique: 3 simple erupted, 1 vertical mattress, 1 horizontal mattress.   Number of sutures:  5 Approximation:    Approximation:  Close Post-procedure details:    Dressing:  Antibiotic ointment, sterile dressing and splint for protection   Patient tolerance of procedure:  Tolerated well, no immediate complications Comments:  V-shaped laceration with a lot of tension on the wound due to proximity of the PIP joint.  Attempted to close the apex with a simple interrupted suture, but due to the tension on the suture did not hold.    (including critical care time)  Medications Ordered in ED Medications  lidocaine (XYLOCAINE) 2 % (with pres) injection 200 mg (200 mg Infiltration Given 07/09/18 2220)     Initial Impression / Assessment and Plan / ED Course  I have reviewed the triage vital signs and the nursing notes.  Pertinent labs & imaging results that were available during my care of the patient were reviewed by me and considered in my medical decision making (see chart for details).        Pressure irrigation performed. Wound explored and base of wound visualized in a bloodless field without evidence of foreign body.  Laceration occurred < 8 hours prior to repair which was well tolerated. Tdap is update.  Pt has no comorbidities to effect normal wound healing. Pt discharged without antibiotics.  Discussed suture home care with patient and answered questions. Pt to follow-up for wound check and suture removal in 7 days; they are to return to the ED sooner for signs of infection. Pt is hemodynamically stable with no complaints prior to dc.    Final Clinical Impressions(s) / ED Diagnoses   Final diagnoses:  Laceration of right ring  finger without foreign body without damage to nail, initial encounter    ED Discharge Orders    None       Barkley Boards, PA-C 07/10/18 0011    Long, Arlyss Repress, MD 07/10/18 1120

## 2018-07-09 NOTE — Discharge Instructions (Addendum)
Thank you for allowing me to care for you today in the Emergency Department.  You had sutures placed in your right ring finger today.  These need to be removed in approximately 7 days.  You can have them removed at your primary care provider's office, urgent care, or by returning to the ER.  To care for your wound at home, clean the area at least once daily with warm water and soap.  You can then apply a topical antibiotic ointment, such as bacitracin or Neosporin, and a bandage to the digit.  You should wear the splint that you were given at all times to avoid ripping out the stitches, you may happen if you bend at the finger based on the location of the wound.  Take 600 mg of ibuprofen with food or 650 mg of Tylenol once every 6 hours for pain control or alternate between these 2 medications every 3 hours.  You can apply an ice pack for 15 to 20 minutes as frequently as needed to help with pain and swelling.  Return to the emergency department if you develop severe swelling to the right ring finger, if the fingertip turns blue, if your stitches ripped out, if you start to have thick, mucus-like drainage from the wound, or other new, concerning symptoms.

## 2018-07-09 NOTE — ED Notes (Signed)
ED Provider at bedside. 

## 2018-07-17 ENCOUNTER — Ambulatory Visit (HOSPITAL_COMMUNITY)
Admission: EM | Admit: 2018-07-17 | Discharge: 2018-07-17 | Disposition: A | Discharge status: Home / Self Care | Payer: Self-pay

## 2018-07-17 NOTE — ED Triage Notes (Signed)
Pt is here to have her suture removed. Pt has 5 sutures.

## 2020-10-13 ENCOUNTER — Ambulatory Visit (INDEPENDENT_AMBULATORY_CARE_PROVIDER_SITE_OTHER): Payer: Self-pay

## 2020-10-13 ENCOUNTER — Other Ambulatory Visit: Payer: Self-pay

## 2020-10-13 ENCOUNTER — Ambulatory Visit (HOSPITAL_COMMUNITY)
Admission: EM | Admit: 2020-10-13 | Discharge: 2020-10-13 | Disposition: A | Discharge status: Home / Self Care | Payer: Self-pay | Attending: Physician Assistant | Admitting: Physician Assistant

## 2020-10-13 ENCOUNTER — Encounter (HOSPITAL_COMMUNITY): Payer: Self-pay | Admitting: Emergency Medicine

## 2020-10-13 DIAGNOSIS — M25512 Pain in left shoulder: Secondary | ICD-10-CM

## 2020-10-13 DIAGNOSIS — S9032XA Contusion of left foot, initial encounter: Secondary | ICD-10-CM

## 2020-10-13 NOTE — Discharge Instructions (Addendum)
Return if any problems.

## 2020-10-13 NOTE — ED Provider Notes (Signed)
[facility]    CSN: [id number] Arrival date & time: 10/13/20  1049      History   Chief Complaint Chief Complaint  Patient presents with   Shoulder Pain    Left   Ankle Pain    HPI Erin Garcia is a 25 y.o. female.   Pt reports she slipped and fell and hit her left shoulder.  Pt also complains of soreness left foot.  Pt reports pain in shoulder when arm drops.  No pain when she holds arm.    The history is provided by the patient. No language interpreter was used.  Ankle Pain Fall This is a new problem. The current episode started yesterday. The problem occurs constantly. The problem has not changed since onset.Nothing aggravates the symptoms. Nothing relieves the symptoms. She has tried nothing for the symptoms.   Past Medical History:  Diagnosis Date   Urticaria     Patient Active Problem List   Diagnosis Date Noted   Acute urticaria 05/26/2018   History of asthma 05/26/2018    History reviewed. No pertinent surgical history.  OB History   No obstetric history on file.      Home Medications    Prior to Admission medications   Medication Sig Start Date End Date Taking? Authorizing Provider  albuterol (PROVENTIL HFA;VENTOLIN HFA) 108 (90 Base) MCG/ACT inhaler Inhale into the lungs every 6 (six) hours as needed for wheezing or shortness of breath.    [provider]  budesonide-formoterol (SYMBICORT) 160-4.5 MCG/ACT inhaler Inhale 2 puffs into the lungs 2 (two) times daily.    [provider]  diphenhydrAMINE (BENADRYL) 50 MG tablet Take 50 mg by mouth at bedtime as needed for itching.    [provider]  hydrOXYzine (ATARAX/VISTARIL) 25 MG tablet Take 1 tablet (25 mg total) by mouth every 6 (six) hours as needed for itching. 05/21/18   Sabas Sous, MD  montelukast (SINGULAIR) 10 MG tablet Take 1 tablet (10 mg total) by mouth at bedtime. 05/26/18   Alfonse Spruce, MD  norgestimate-ethinyl estradiol  (ORTHO-CYCLEN,SPRINTEC,PREVIFEM) 0.25-35 MG-MCG tablet Take 1 tablet by mouth daily.    [provider]    Family History Family History  Problem Relation Age of Onset   Allergic rhinitis Neg Hx    Asthma Neg Hx    Eczema Neg Hx    Urticaria Neg Hx     Social History Social History   Tobacco Use   Smoking status: Former    Pack years: 0.00    Types: Cigarettes   Smokeless tobacco: Never  Vaping Use   Vaping Use: Never used  Substance Use Topics   Alcohol use: Yes    Comment: occ   Drug use: Not Currently     Allergies   Patient has no known allergies.   Review of Systems Review of Systems  Musculoskeletal:  Positive for arthralgias.  All other systems reviewed and are negative.   Physical Exam Triage Vital Signs ED Triage Vitals  Enc Vitals Group     BP 10/13/20 1118 138/86     Pulse Rate 10/13/20 1118 96     Resp 10/13/20 1118 17     Temp 10/13/20 1118 98.6 F (37 C)     Temp Source 10/13/20 1118 Oral     SpO2 10/13/20 1118 98 %     Weight --      Height --      Head Circumference --  Peak Flow --      Pain Score 10/13/20 1115 3     Pain Loc --      Pain Edu? --      Excl. in GC? --    No data found.  Updated Vital Signs BP 138/86 (BP Location: Right Arm)   Pulse 96   Temp 98.6 F (37 C) (Oral)   Resp 17   LMP 09/16/2020   SpO2 98%   Visual Acuity Right Eye Distance:   Left Eye Distance:   Bilateral Distance:    Right Eye Near:   Left Eye Near:    Bilateral Near:     Physical Exam Vitals reviewed.  Cardiovascular:     Rate and Rhythm: Normal rate.  Pulmonary:     Effort: Pulmonary effort is normal.  Abdominal:     General: Abdomen is flat.  Musculoskeletal:        General: Swelling and tenderness present.  Skin:    General: Skin is warm.  Neurological:     General: No focal deficit present.     Mental Status: She is alert.  Psychiatric:        Mood and Affect: Mood normal.     UC Treatments / Results   Labs (all labs ordered are listed, but only abnormal results are displayed) Labs Reviewed - No data to display  EKG   Radiology DG Shoulder Left  Result Date: 10/13/2020 CLINICAL DATA:  fall EXAM: LEFT SHOULDER - 2+ VIEW COMPARISON:  None. FINDINGS: There is no evidence of fracture or dislocation. There is no evidence of arthropathy or other focal bone abnormality. Soft tissues are unremarkable. IMPRESSION: Negative left shoulder radiographs. Electronically Signed   By: Caprice Renshaw   On: 10/13/2020 12:26    Procedures Procedures (including critical care time)  Medications Ordered in UC Medications - No data to display  Initial Impression / Assessment and Plan / UC Course  I have reviewed the triage vital signs and the nursing notes.  Pertinent labs & imaging results that were available during my care of the patient were reviewed by me and considered in my medical decision making (see chart for details).    MDM:  Pt placed in  a sling.  Pt advised to take ibuprofen, ice to area of pain  Final Clinical Impressions(s) / UC Diagnoses   Final diagnoses:  Acute pain of left shoulder  Contusion of left foot, initial encounter   Discharge Instructions   None    ED Prescriptions   None    PDMP not reviewed this encounter. An After Visit Summary was printed and given to the patient.    Elson Areas, [place] 10/13/20 1337

## 2020-10-13 NOTE — ED Triage Notes (Signed)
Pt Presents with left shoulder pain and left ankle pain after slipping and falling in shower last night. States landed on left side. States unable to move shoulder with certain movements.

## 2023-01-20 IMAGING — DX DG SHOULDER 2+V*L*
4 series · 4 of 4 positions shown · non-contrast
Comparison: None.

CLINICAL DATA: fall

EXAM:
LEFT SHOULDER - 2+ VIEW

[shoulder ap]
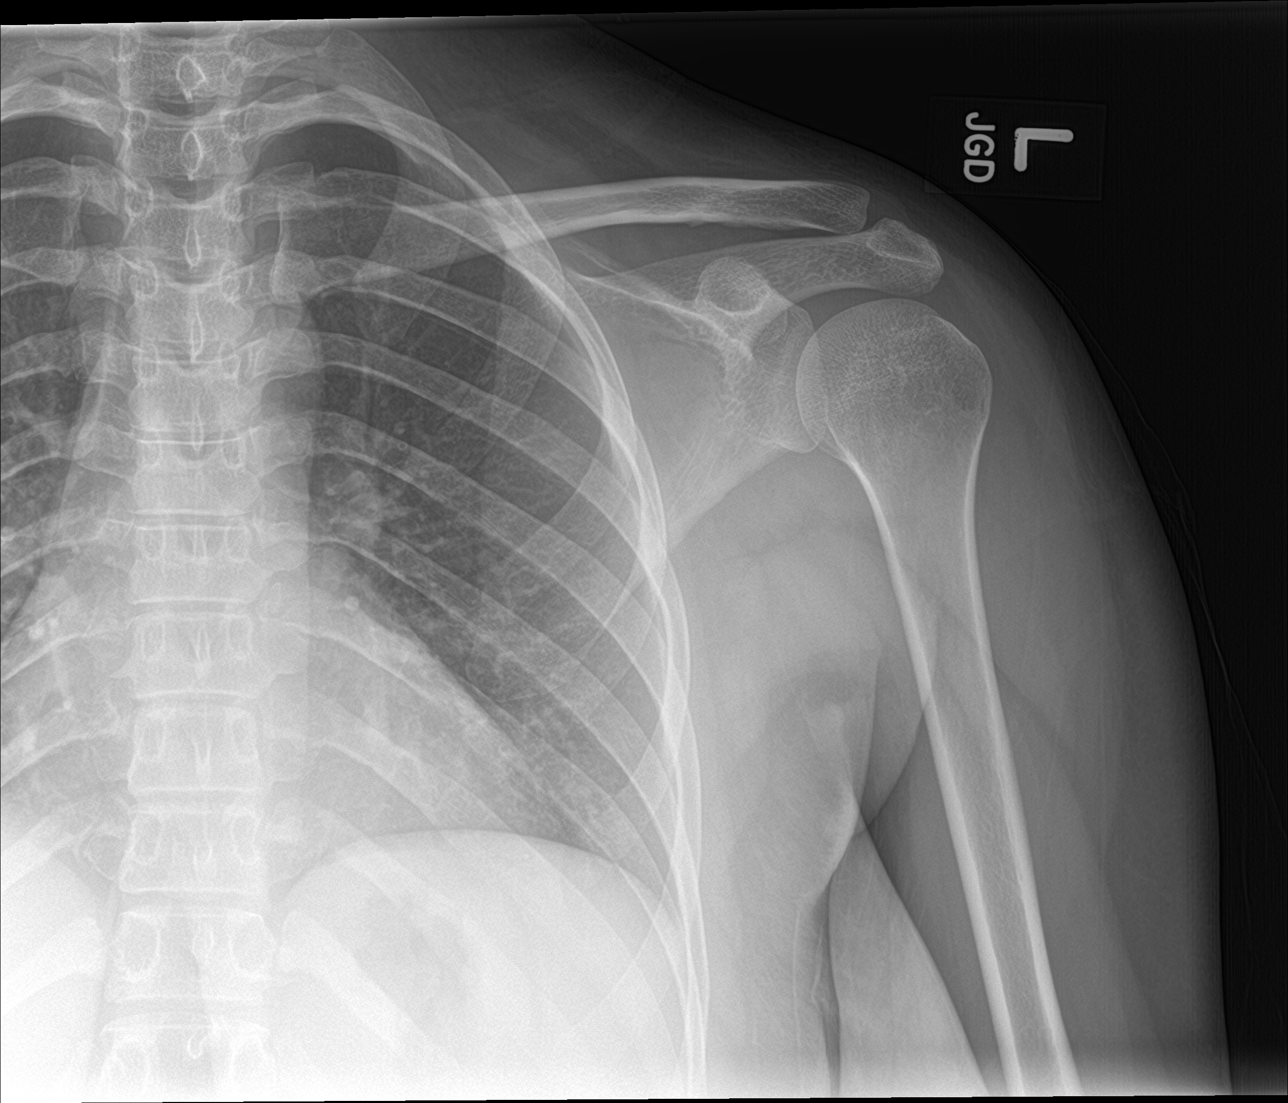

[shoulder grashey]
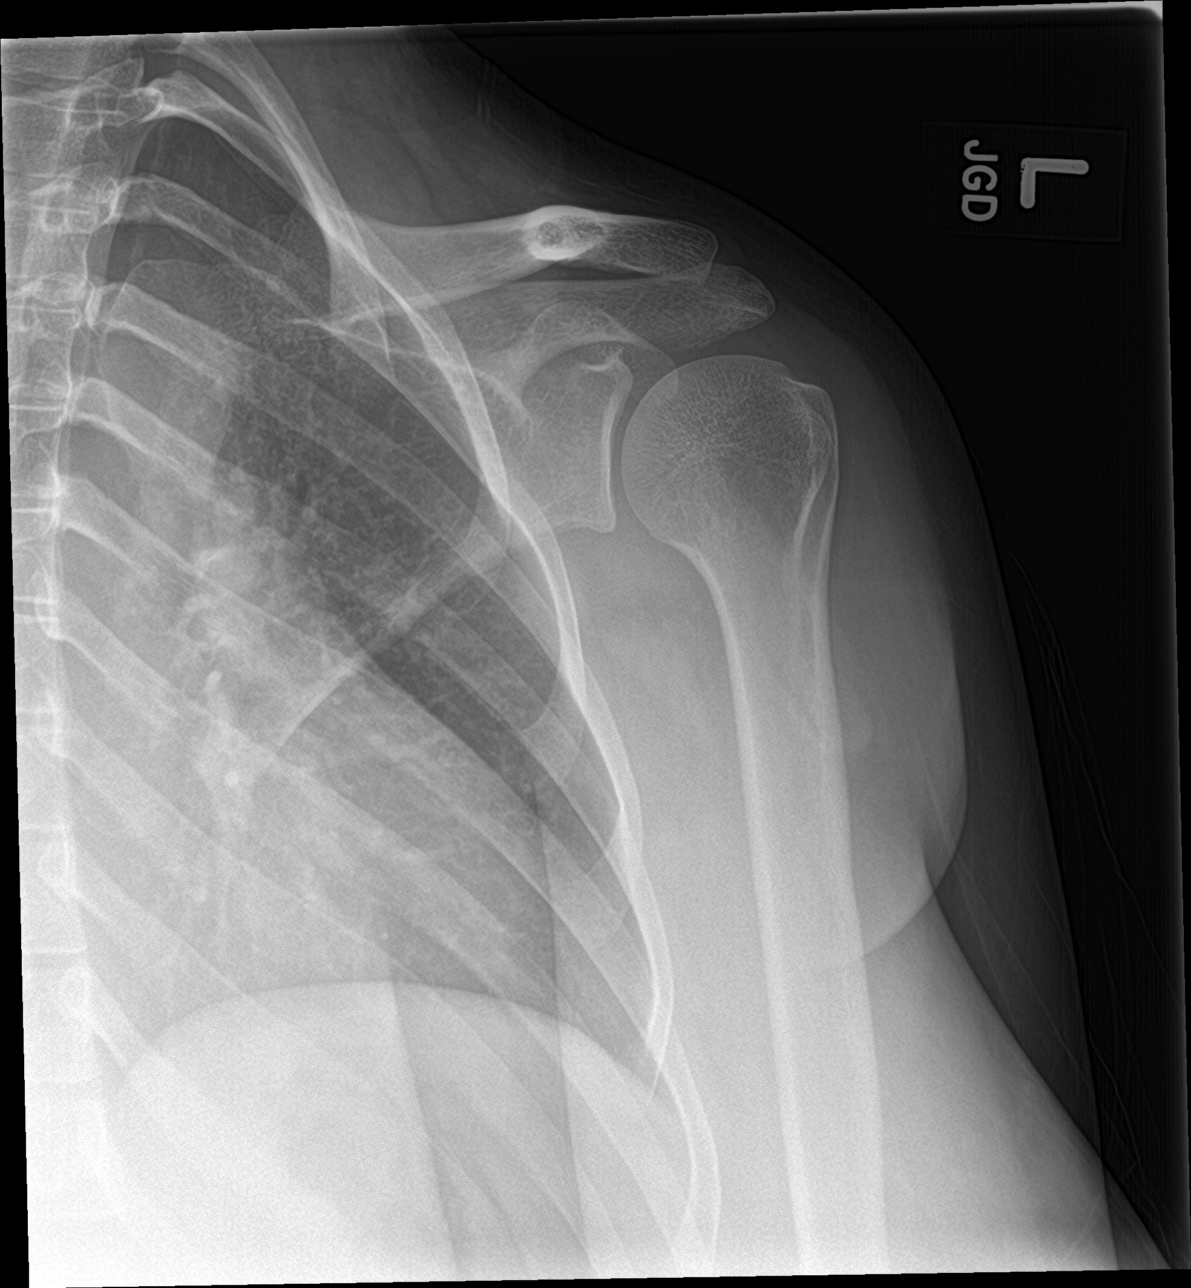

[shoulder y-view]
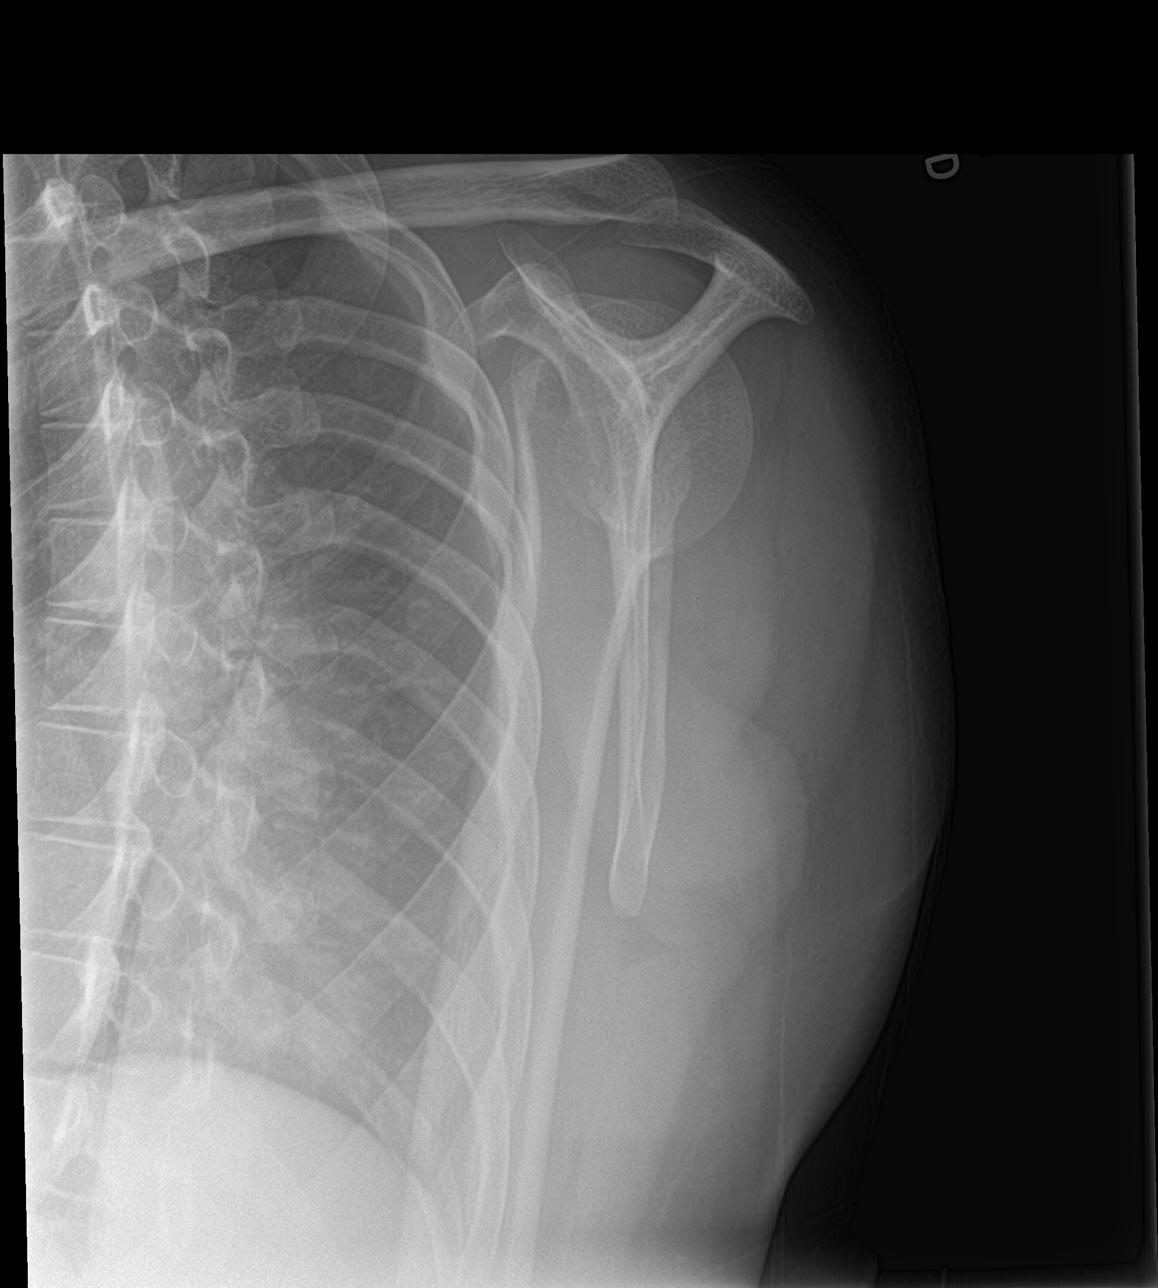

[shoulder axial]
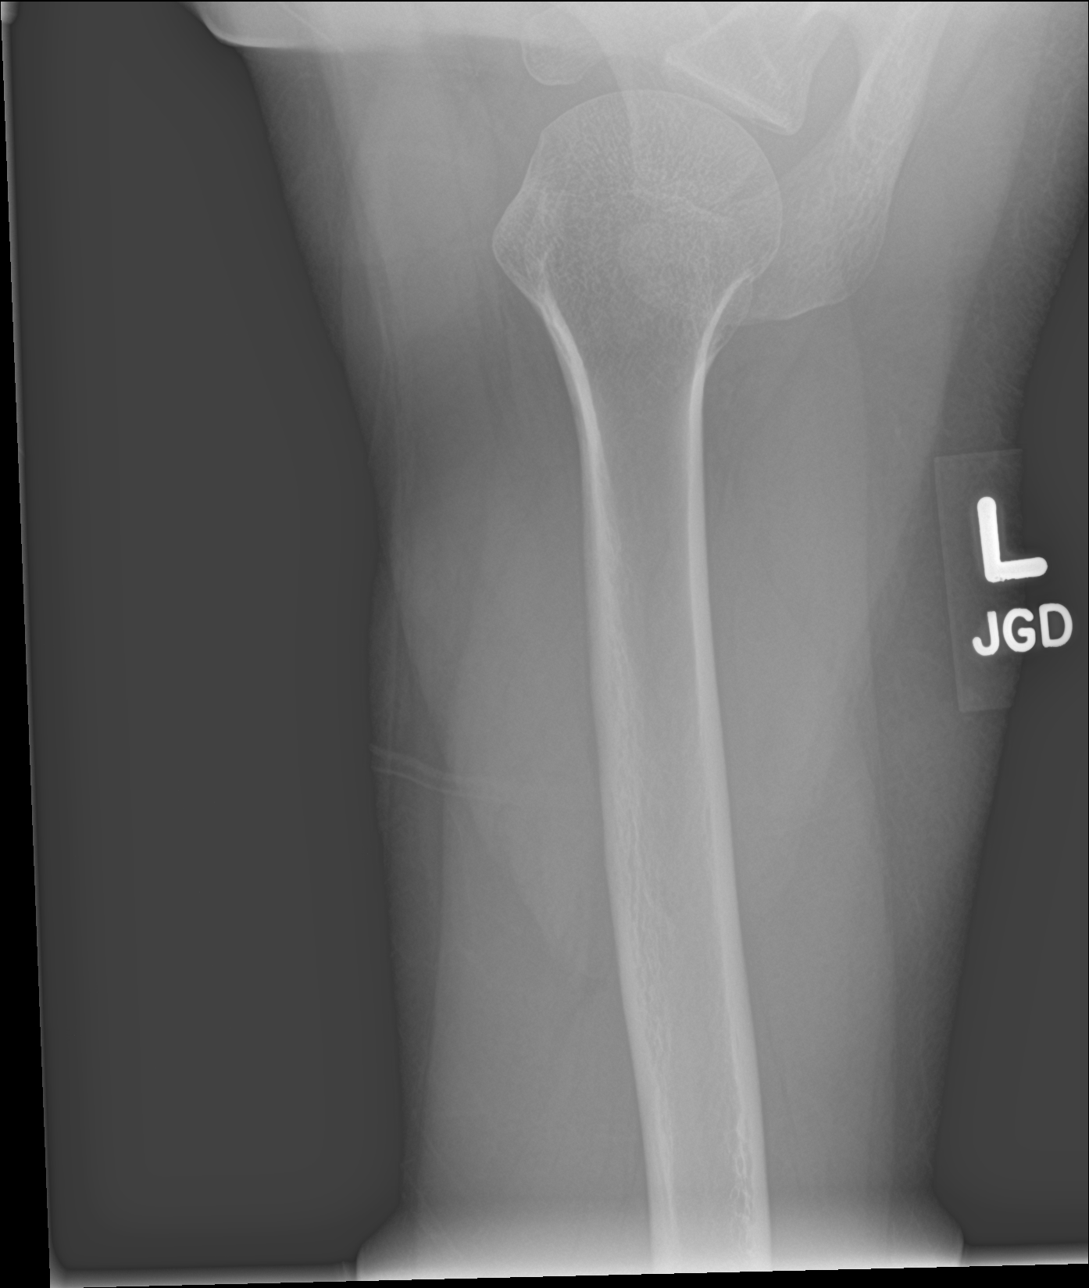

[4 of 4 positions shown; findings below may reference images not displayed]

FINDINGS: There is no evidence of fracture or dislocation. There is no
evidence of arthropathy or other focal bone abnormality. Soft
tissues are unremarkable.
IMPRESSION: Negative left shoulder radiographs.
